# Patient Record
Sex: Male | Born: 2000 | Race: Black or African American | Hispanic: No | Marital: Single | Smoking: Never smoker
Health system: Southern US, Community
[De-identification: ages and names within clinical notes are randomized; demographics above are authoritative.]

## PROBLEM LIST (undated history)

## (undated) DIAGNOSIS — F909 Attention-deficit hyperactivity disorder, unspecified type: Secondary | ICD-10-CM

## (undated) DIAGNOSIS — F913 Oppositional defiant disorder: Secondary | ICD-10-CM

## (undated) DIAGNOSIS — R4689 Other symptoms and signs involving appearance and behavior: Secondary | ICD-10-CM

## (undated) DIAGNOSIS — F4310 Post-traumatic stress disorder, unspecified: Secondary | ICD-10-CM

---
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
## 2022-04-26 ENCOUNTER — Ambulatory Visit
Admission: EM | Admit: 2022-04-26 | Discharge: 2022-04-26 | Disposition: A | Discharge status: Home / Self Care | Payer: Medicaid Other | Attending: Physician Assistant | Admitting: Physician Assistant

## 2022-04-26 DIAGNOSIS — H1031 Unspecified acute conjunctivitis, right eye: Secondary | ICD-10-CM

## 2022-04-26 MED ORDER — POLYMYXIN B-TRIMETHOPRIM 10000-0.1 UNIT/ML-% OP SOLN: 1.0000 [drp] | OPHTHALMIC | 0 refills | Status: AC

## 2022-04-26 NOTE — ED Triage Notes (Signed)
Pt stays right eye redness and itching for the past 2 days.

## 2022-04-26 NOTE — ED Provider Notes (Signed)
[facility]    CSN: [id number] Arrival date & time: 04/26/22  6440      History   Chief Complaint Chief Complaint  Patient presents with   Conjunctivitis    HPI Peter Stephens is a 21 y.o. male.   Patient here today for evaluation of right eye redness and itching that is been ongoing for the last 2 days.  He reports he did have a headache initially but this has resolved.  He has had some drainage from his right eye.  He reports some occasional nausea but no vomiting.  He does not report any treatment for symptoms.  The history is provided by the patient.  Conjunctivitis Pertinent negatives include no headaches and no shortness of breath.    Past Medical History:  Diagnosis Date   Attention deficit hyperactivity disorder (ADHD)    Oppositional defiant behavior    PTSD (post-traumatic stress disorder)     There are no problems to display for this patient.   History reviewed. No pertinent surgical history.     Home Medications    Prior to Admission medications   Medication Sig Start Date End Date Taking? Authorizing Provider  trimethoprim-polymyxin b (POLYTRIM) ophthalmic solution Place 1 drop into the right eye every 4 (four) hours for 7 days. 04/26/22 05/03/22 Yes Francene Finders, PA-C  predniSONE (DELTASONE) 10 MG tablet 3 tabs po qd days 1-5, 2 tabs po qd days 6-10, 1 tab po qd days 11-14 Patient not taking: Reported on 02/18/2020 11/11/16   Charmayne Sheer, NP    Family History History reviewed. No pertinent family history.  Social History Social History   Tobacco Use   Smoking status: Never   Smokeless tobacco: Never  Substance Use Topics   Alcohol use: No   Drug use: No     Allergies   Patient has no known allergies.   Review of Systems Review of Systems  Constitutional:  Negative for chills and fever.  Eyes:  Positive for discharge and redness.  Respiratory:  Negative for shortness of breath.   Gastrointestinal:  Positive for  nausea. Negative for vomiting.  Neurological:  Negative for headaches.     Physical Exam Triage Vital Signs ED Triage Vitals  Enc Vitals Group     BP 04/26/22 1007 121/84     Pulse Rate 04/26/22 1007 79     Resp 04/26/22 1007 16     Temp 04/26/22 1007 97.8 F (36.6 C)     Temp Source 04/26/22 1007 Oral     SpO2 04/26/22 1007 98 %     Weight --      Height --      Head Circumference --      Peak Flow --      Pain Score 04/26/22 1008 6     Pain Loc --      Pain Edu? --      Excl. in [place]? --    No data found.  Updated Vital Signs BP 121/84 (BP Location: Left Arm)   Pulse 79   Temp 97.8 F (36.6 C) (Oral)   Resp 16   SpO2 98%      Physical Exam Vitals and nursing note reviewed.  Constitutional:      General: He is not in acute distress.    Appearance: Normal appearance. He is not ill-appearing.  HENT:     Head: Normocephalic and atraumatic.     Nose: Nose normal. No congestion or rhinorrhea.  Eyes:  Extraocular Movements: Extraocular movements intact.     Pupils: Pupils are equal, round, and reactive to light.     Comments: Right conjunctiva injected, left conjunctiva normal  Cardiovascular:     Rate and Rhythm: Normal rate.  Pulmonary:     Effort: Pulmonary effort is normal. No respiratory distress.  Neurological:     Mental Status: He is alert.  Psychiatric:        Mood and Affect: Mood normal.        Behavior: Behavior normal.      UC Treatments / Results  Labs (all labs ordered are listed, but only abnormal results are displayed) Labs Reviewed - No data to display  EKG   Radiology No results found.  Procedures Procedures (including critical care time)  Medications Ordered in UC Medications - No data to display  Initial Impression / Assessment and Plan / UC Course  I have reviewed the triage vital signs and the nursing notes.  Pertinent labs & imaging results that were available during my care of the patient were reviewed by me and  considered in my medical decision making (see chart for details).    Will treat with antibiotic drops to cover conjunctivitis.  Encouraged follow-up if no gradual improvement or with any further concerns.  Final Clinical Impressions(s) / UC Diagnoses   Final diagnoses:  Acute conjunctivitis of right eye, unspecified acute conjunctivitis type   Discharge Instructions   None    ED Prescriptions     Medication Sig Dispense Auth. Provider   trimethoprim-polymyxin b (POLYTRIM) ophthalmic solution Place 1 drop into the right eye every 4 (four) hours for 7 days. 10 mL Tomi Bamberger, PA-C      PDMP not reviewed this encounter.   Tomi Bamberger, PA-C 04/26/22 1202

## 2022-10-09 ENCOUNTER — Institutional Professional Consult (permissible substitution): Payer: Medicaid Other | Admitting: Medical

## 2022-10-11 ENCOUNTER — Ambulatory Visit: Payer: Medicaid Other | Admitting: Medical

## 2022-10-11 VITALS — BP 110/70 | HR 95 | Ht 65.0 in | Wt 157.6 lb

## 2022-10-11 DIAGNOSIS — F909 Attention-deficit hyperactivity disorder, unspecified type: Secondary | ICD-10-CM | POA: Diagnosis not present

## 2022-10-11 DIAGNOSIS — G479 Sleep disorder, unspecified: Secondary | ICD-10-CM

## 2022-10-11 MED ORDER — LISDEXAMFETAMINE DIMESYLATE 30 MG PO CAPS: 30.0000 mg | ORAL_CAPSULE | Freq: Every day | ORAL | 0 refills | Status: DC

## 2022-10-11 MED ORDER — TRAZODONE HCL 50 MG PO TABS: 25.0000 mg | ORAL_TABLET | Freq: Every evening | ORAL | 0 refills | Status: DC | PRN

## 2022-10-11 NOTE — Progress Notes (Signed)
Subjective:  Peter Stephens is a 22 y.o. male who presents for Chief Complaint  Patient presents with   get established    Get established. Wants to get back on medication for ADHD- stopped med since 2021. Wants on Vynase, having some stress with housing issues     Here as a new patient.   Was seeing pediatrics prior.   Had been seeing them the last 10 years.  He notes hx/o ADHD.  He notes hx/o sleeping issues.  Has trouble with focus and getting tasks done.   Was last on medication 2021.  Wants to get back on medication to help with focus, distractions.   He notes depression in the past, but not a big issue now.   Does feels stressed at times.  Hospitalized in the past for behavior issues, 22yo.  No current or prior SI/HI.  Grew up with grandparents, 1 passed 2 years ago.  Interacts with grandmother, 2 brothes and aunt.  Was on vyvanse and guanfacine prior  Has relationship with mother.  Doesn't have relationship with father.    Was on trazodone for sleep.    Working full time at [employer], been there 6 months.   Lives with grandmother temporarily.    Smokes occasionally.  No alcohol.  No drugs.  Exercise - calisthenics.    Looking into [employer] program.  This will help him with a trade.  No other aggravating or relieving factors.    No other c/o.  Past Medical History:  Diagnosis Date   Attention deficit hyperactivity disorder (ADHD)    Oppositional defiant behavior    PTSD (post-traumatic stress disorder)    No current outpatient medications on file prior to visit.   No current facility-administered medications on file prior to visit.     The following portions of the patient's history were reviewed and updated as appropriate: allergies, current medications, past family history, past medical history, past social history, past surgical history and problem list.  ROS Otherwise as in subjective above    Objective: BP 110/70   Pulse 95   Ht 5\' 5"  (1.651 m)   Wt 157 lb  9.6 oz (71.5 kg)   BMI 26.23 kg/m   General appearance: alert, no distress, well developed, well nourished Neck: supple, no lymphadenopathy, no thyromegaly, no masses Heart: RRR, normal S1, S2, no murmurs Lungs: CTA bilaterally, no wheezes, rhonchi, or rales Pulses: 2+ radial pulses, 2+ pedal pulses, normal cap refill Ext: no edema Psych: pleasant, a little anxious appearing    Assessment: Encounter Diagnoses  Name Primary?   Attention deficit hyperactivity disorder (ADHD), unspecified ADHD type Yes   Sleep disturbances      Plan: We discussed the presenting concerns and prior mental health history  I advised that I am not a psychiatrist or counselor but we can work with him for the time being assuming he is getting good response and compliance with the medication  We discussed proper use of medication, keeping medication safe keeping, not using inappropriate amounts.  Begin back on Vyvanse.  Discussed risk and benefit of the medication, proper use of medication.  He is going to try this first without the trazodone but if he starts having sleep issues he can begin back on trazodone which she has been on in the past.  He notes prior to being on higher doses of both medicines along with oxcarbazepine as well  We will try and get prior records  Peter Stephens was seen today for get established.  Diagnoses and all orders for this visit:  Attention deficit hyperactivity disorder (ADHD), unspecified ADHD type  Sleep disturbances  Other orders -     traZODone (DESYREL) 50 MG tablet; Take 0.5-1 tablets (25-50 mg total) by mouth at bedtime as needed for sleep. -     lisdexamfetamine (VYVANSE) 30 MG capsule; Take 1 capsule (30 mg total) by mouth daily.    Follow up: 63mo

## 2022-10-11 NOTE — Patient Instructions (Signed)
Recommendations: Begin back on Vyvanse once daily Try this for now.  If you start to have sleep issues, then you can add Trazodone at night 1/2 - 1 tablet nightly as needed. If you are not having sleep issues, then don't start the Trazodone. Keep your medicaiton safe, don't let others use the medication We will check random drug screens from time to time Have a consistent schedule Exercise regularly Avoid caffeine and soda and other sugary drinks Lets recheck in 1 month, sooner if needed   Attention Deficit Hyperactivity Disorder, Adult Attention deficit hyperactivity disorder (ADHD) is a mental health disorder that starts during childhood. For many people with ADHD, the disorder continues into the adult years. Treatment can help you manage your symptoms. There are three main types of ADHD: Inattentive. With this type, adults have difficulty paying attention. This may affect cognitive abilities. Hyperactive-impulsive. With this type, adults have a lot of energy and have difficulty controlling their behavior. Combination type. Some people may have symptoms of both types. What are the causes? The exact cause of ADHD is not known. Most experts believe a person's genes and environment possibly contribute to ADHD. What increases the risk? The following factors may make you more likely to develop this condition: Having a first-degree relative such as a parent, brother, or sister, with the condition. Being born before 97 weeks of pregnancy (prematurely) or at a low birth weight. Being born to a mother who smoked tobacco or drank alcohol during pregnancy. Having experienced a brain injury. Being exposed to lead or other toxins in the womb or early in life. What are the signs or symptoms? Symptoms of this condition depend on the type of ADHD. Symptoms of the inattentive type include: Difficulty paying attention or following instructions. Often making simple mistakes. Being  disorganized. Avoiding tasks that require time and attention. Losing and forgetting things. Symptoms of the hyperactive-impulsive type include: Restlessness. Talking out of turn, interrupting others, or talking too much. Difficulty with: Sitting still. Feeling motivated. Relaxing. Waiting in line or waiting for a turn. People with the combination type have symptoms of both of the other types. In adults, this condition may lead to certain problems, such as: Keeping jobs. Performing tasks at work. Having stable relationships. Being on time or keeping to a schedule. How is this diagnosed? This condition is diagnosed based on your current symptoms and your history of symptoms. The diagnosis can be made by a health care provider such as a primary care provider or a mental health care specialist. Your health care provider may use a symptom checklist or a behavior rating scale to evaluate your symptoms. Your health care provider may also want to talk with people who have observed your behaviors throughout your life. How is this treated? This condition can be treated with medicines and behavior therapy. Medicines may be the best option to reduce impulsive behaviors and improve attention. Your health care provider may recommend: Stimulant medicines. These are the most common medicines used for adult ADHD. They affect certain chemicals in the brain (neurotransmitters) and improve your ability to control your symptoms. A non-stimulant medicine. These medicines can also improve focus, attention, and impulsive behavior. It may take weeks to months to see the effects of this medicine. Counseling and behavioral management are also important for treating ADHD. Counseling is often used along with medicine. Your health care provider may suggest: Cognitive behavioral therapy (CBT). This type of therapy teaches you to replace negative thoughts and actions with positive thoughts and  actions. When used as part of  ADHD treatment, this therapy may also include: Coping strategies for organization, time management, impulse control, and stress reduction. Mindfulness and meditation training. Behavioral management. You may work with a Leisure [employer] manager who is specially trained to help people with ADHD manage and organize activities and function more effectively. Follow these instructions at home: Medicines  Take over-the-counter and prescription medicines only as told by your health care provider. Talk with your health care provider about the possible side effects of your medicines and how to manage them. Alcohol use Do not drink alcohol if: Your health care provider tells you not to drink. You are pregnant, may be pregnant, or are planning to become pregnant. If you drink alcohol: Limit how much you use to: 0-1 drink a day for women. 0-2 drinks a day for men. Know how much alcohol is in your drink. In the [place] one drink equals one 12 oz bottle of beer (355 mL), one 5 oz glass of wine (148 mL), or one 1 oz glass of hard liquor (44 mL). Lifestyle  Do not use illegal drugs. Get enough sleep. Eat a healthy diet. Exercise regularly. Exercise can help to reduce stress and anxiety. General instructions Learn as much as you can about adult ADHD, and work closely with your health care providers to find the treatments that work best for you. Follow the same schedule each day. Use reminder devices like notes, calendars, and phone apps to stay on time and organized. Keep all follow-up visits. Your health care provider will need to monitor your condition and adjust your treatment over time. Where to find more information A health care provider may be able to recommend resources that are available online or over the phone. You could start with: Attention Deficit Disorder Association (ADDA): [employer] National Institute of Mental Health [facility]): https://www.frey.org/ Contact a health care provider if: Your symptoms continue to cause  problems. You have side effects from your medicine, such as: Repeated muscle twitches, coughing, or speech outbursts. Sleep problems. Loss of appetite. Dizziness. Unusually fast heartbeat. Stomach pains. Headaches. You are struggling with anxiety, depression, or substance abuse. Get help right away if: You have a severe reaction to a medicine. This symptom may be an emergency. Get help right away. Call 911. Do not wait to see if the symptom will go away. Do not drive yourself to the hospital. Take one of these steps if you feel like you may hurt yourself or others, or have thoughts about taking your own life: Go to your nearest emergency room. Call 911. Call the [place] at [phone] or 988. This is open 24 hours a day Text the Crisis Text Line at (805) 263-4156. Summary ADHD is a mental health disorder that starts during childhood and often continues into your adult years. The exact cause of ADHD is not known. Most experts believe genetics and environmental factors contribute to ADHD. There is no cure for ADHD, but treatment with medicine, cognitive behavioral therapy, or behavioral management can help you manage your condition. This information is not intended to replace advice given to you by your health care provider. Make sure you discuss any questions you have with your health care provider. Document Revised: 11/02/2021 Document Reviewed: 11/02/2021 Elsevier Patient Education  [place].

## 2022-10-25 ENCOUNTER — Other Ambulatory Visit: Payer: Self-pay | Admitting: Family Medicine

## 2022-10-25 MED ORDER — LISDEXAMFETAMINE DIMESYLATE 30 MG PO CAPS: 30.0000 mg | ORAL_CAPSULE | Freq: Every day | ORAL | 0 refills | Status: DC

## 2022-11-19 ENCOUNTER — Other Ambulatory Visit: Payer: Self-pay | Admitting: Medical

## 2022-11-19 ENCOUNTER — Telehealth: Payer: Self-pay | Admitting: Medical

## 2022-11-19 ENCOUNTER — Other Ambulatory Visit (HOSPITAL_COMMUNITY): Payer: Self-pay

## 2022-11-19 MED ORDER — TRAZODONE HCL 50 MG PO TABS
25.0000 mg | ORAL_TABLET | Freq: Every evening | ORAL | 0 refills | Status: DC | PRN
  Filled 2022-11-19: qty 30, 30d supply, fill #0

## 2022-11-19 MED ORDER — LISDEXAMFETAMINE DIMESYLATE 30 MG PO CAPS
30.0000 mg | ORAL_CAPSULE | Freq: Every day | ORAL | 0 refills | Status: AC
  Filled 2022-11-19: qty 30, 30d supply, fill #0

## 2022-11-19 NOTE — Telephone Encounter (Signed)
Tejuan called and stated he hasn't been able to pick up his vyvanse and trazodone because the current pharmacy was out of stock and he wasn't aware to try and call other pharmacies until someone helped him. He would like these prescriptions sent to [facility]   Once it has been changed ill give him a call and let him know.

## 2022-11-20 ENCOUNTER — Other Ambulatory Visit (HOSPITAL_COMMUNITY): Payer: Self-pay

## 2022-12-16 ENCOUNTER — Ambulatory Visit
Admission: EM | Admit: 2022-12-16 | Discharge: 2022-12-16 | Disposition: A | Discharge status: Home / Self Care | Payer: Medicaid Other | Attending: Internal Medicine | Admitting: Internal Medicine

## 2022-12-16 DIAGNOSIS — R300 Dysuria: Secondary | ICD-10-CM | POA: Insufficient documentation

## 2022-12-16 DIAGNOSIS — Z7251 High risk heterosexual behavior: Secondary | ICD-10-CM | POA: Diagnosis present

## 2022-12-16 LAB — POCT URINALYSIS DIP (MANUAL ENTRY)
Bilirubin, UA: NEGATIVE
Glucose, UA: NEGATIVE mg/dL
Nitrite, UA: NEGATIVE
Protein Ur, POC: NEGATIVE mg/dL
Spec Grav, UA: 1.025 (ref 1.010–1.025)
Urobilinogen, UA: 0.2 E.U./dL
pH, UA: 6.5 (ref 5.0–8.0)

## 2022-12-16 NOTE — ED Provider Notes (Signed)
[facility]    CSN: [id number] Arrival date & time: 12/16/22  1005      History   Chief Complaint Chief Complaint  Patient presents with   Urinary Tract Infection    HPI Peter Stephens is a 22 y.o. male.   Patient presents to urgent care for evaluation of dysuria that started this morning. States he only experiences burning at the end of the urinary stream. Denies weak urinary stream, urinary hesitancy, and urinary frequency. States he woke up with some lower abdominal pain this morning but this has since resolved without intervention. No gross hematuria or fever/chills. Denies N/V/D. States he has been in a monogamous relationship for 1.5 months with male partner with unprotected intercourse. No known exposures to STDs. Denies penile discharge, rash, and odor/itching. Denies recent antibiotic/steroid use and no allergies to antibiotics.    Urinary Tract Infection   Past Medical History:  Diagnosis Date   Attention deficit hyperactivity disorder (ADHD)    Oppositional defiant behavior    PTSD (post-traumatic stress disorder)     There are no problems to display for this patient.   History reviewed. No pertinent surgical history.     Home Medications    Prior to Admission medications   Medication Sig Start Date End Date Taking? Authorizing Provider  lisdexamfetamine (VYVANSE) 30 MG capsule Take 1 capsule (30 mg total) by mouth daily. 10/25/22   Ronnald Nian, MD  lisdexamfetamine (VYVANSE) 30 MG capsule Take 1 capsule (30 mg total) by mouth daily. 11/19/22   Tysinger, Kermit Balo, PA-C  traZODone (DESYREL) 50 MG tablet Take 0.5-1 tablets (25-50 mg total) by mouth at bedtime as needed for sleep. 11/19/22   Tysinger, Kermit Balo, PA-C    Family History History reviewed. No pertinent family history.  Social History Social History   Tobacco Use   Smoking status: Never   Smokeless tobacco: Never  Substance Use Topics   Alcohol use: No   Drug use: No      Allergies   Patient has no known allergies.   Review of Systems Review of Systems Per HPI  Physical Exam Triage Vital Signs ED Triage Vitals [12/16/22 1033]  Enc Vitals Group     BP 122/83     Pulse Rate 90     Resp 20     Temp 97.9 F (36.6 C)     Temp Source Oral     SpO2 97 %     Weight      Height      Head Circumference      Peak Flow      Pain Score 0     Pain Loc      Pain Edu?      Excl. in GC?    No data found.  Updated Vital Signs BP 122/83 (BP Location: Left Arm)   Pulse 90   Temp 97.9 F (36.6 C) (Oral)   Resp 20   SpO2 97%   Visual Acuity Right Eye Distance:   Left Eye Distance:   Bilateral Distance:    Right Eye Near:   Left Eye Near:    Bilateral Near:     Physical Exam Vitals and nursing note reviewed.  Constitutional:      Appearance: He is not ill-appearing or toxic-appearing.  HENT:     Head: Normocephalic and atraumatic.     Right Ear: Hearing and external ear normal.     Left Ear: Hearing and external ear normal.  Nose: Nose normal.     Mouth/Throat:     Lips: Pink.     Mouth: Mucous membranes are moist.     Pharynx: No posterior oropharyngeal erythema.  Eyes:     General: Lids are normal. Vision grossly intact. Gaze aligned appropriately.     Extraocular Movements: Extraocular movements intact.     Conjunctiva/sclera: Conjunctivae normal.  Pulmonary:     Effort: Pulmonary effort is normal.  Abdominal:     General: Abdomen is flat.     Palpations: Abdomen is soft.     Tenderness: There is no right CVA tenderness or left CVA tenderness.  Musculoskeletal:     Cervical back: Neck supple.  Skin:    General: Skin is warm and dry.     Capillary Refill: Capillary refill takes less than 2 seconds.     Findings: No rash.  Neurological:     General: No focal deficit present.     Mental Status: He is alert and oriented to person, place, and time. Mental status is at baseline.     Cranial Nerves: No dysarthria or  facial asymmetry.  Psychiatric:        Mood and Affect: Mood normal.        Speech: Speech normal.        Behavior: Behavior normal.        Thought Content: Thought content normal.        Judgment: Judgment normal.      UC Treatments / Results  Labs (all labs ordered are listed, but only abnormal results are displayed) Labs Reviewed  POCT URINALYSIS DIP (MANUAL ENTRY) - Abnormal; Notable for the following components:      Result Value   Clarity, UA cloudy (*)    Ketones, POC UA moderate (40) (*)    Blood, UA trace-intact (*)    Leukocytes, UA Moderate (2+) (*)    All other components within normal limits  URINE CULTURE  CYTOLOGY, (ORAL, ANAL, URETHRAL) ANCILLARY ONLY    EKG   Radiology No results found.  Procedures Procedures (including critical care time)  Medications Ordered in UC Medications - No data to display  Initial Impression / Assessment and Plan / UC Course  I have reviewed the triage vital signs and the nursing notes.  Pertinent labs & imaging results that were available during my care of the patient were reviewed by me and considered in my medical decision making (see chart for details).   1. Dysuria, unprotected sex STI labs pending.  Patient deflines HIV and syphilis testing today.  Will notify patient of positive results and treat accordingly when labs come back.  Patient to avoid sexual intercourse until screening testing comes back.  Education provided regarding safe sexual practices and patient encouraged to use protection to prevent spread of STIs.   Urinalysis shows signs concerning for possible UTI, however this is low on differential given age and recent unprotected intercourse with new partner. Will send urine for culture to rule out acute cystitis etiology. He is not experiencing any systemic signs/symptoms of infection and vitals are hemodynamically stable. Will treat for UTI as needed based on culture.  .Discussed physical exam and available  lab work findings in clinic with patient.  Counseled patient regarding appropriate use of medications and potential side effects for all medications recommended or prescribed today. Discussed red flag signs and symptoms of worsening condition,when to call the PCP office, return to urgent care, and when to seek higher level of care in  the emergency department. Patient verbalizes understanding and agreement with plan. All questions answered. Patient discharged in stable condition.    Final Clinical Impressions(s) / UC Diagnoses   Final diagnoses:  Dysuria  Unprotected sex     Discharge Instructions      Your STD testing has been sent to the lab and will come back in the next 2 to 3 days.  We will call you if any of your results are positive requiring treatment and treat you at that time.   If you do not receive a phone call from [place], this means your testing was negative.  Avoid sexual intercourse until your STD results come back.  If any of your STD results are positive, you will need to avoid sexual intercourse for 7 days while you are being treated to prevent spread of STD.  Condom use is the best way to prevent spread of STDs.  Your urine shows possible UTI.  I have sent the urine for culture and will call you if there is any growth of bacteria indicating UTI/treat at that time if needed.  Return to urgent care as needed.      ED Prescriptions   None    PDMP not reviewed this encounter.   Carlisle Beers, [place] 12/16/22 1109

## 2022-12-16 NOTE — Discharge Instructions (Signed)
Your STD testing has been sent to the lab and will come back in the next 2 to 3 days.  We will call you if any of your results are positive requiring treatment and treat you at that time.   If you do not receive a phone call from [place], this means your testing was negative.  Avoid sexual intercourse until your STD results come back.  If any of your STD results are positive, you will need to avoid sexual intercourse for 7 days while you are being treated to prevent spread of STD.  Condom use is the best way to prevent spread of STDs.  Your urine shows possible UTI.  I have sent the urine for culture and will call you if there is any growth of bacteria indicating UTI/treat at that time if needed.  Return to urgent care as needed.

## 2022-12-16 NOTE — ED Triage Notes (Signed)
Pt presents for UTI and STD testing.   Pt c/o dysuria, cloudy urine, abd pain X 1 week.   Denies penile discharge, states he has had new partners.

## 2022-12-17 LAB — CYTOLOGY, (ORAL, ANAL, URETHRAL) ANCILLARY ONLY
Chlamydia: POSITIVE — AB
Comment: NEGATIVE
Comment: NEGATIVE
Comment: NORMAL
Neisseria Gonorrhea: POSITIVE — AB
Trichomonas: POSITIVE — AB

## 2022-12-17 LAB — URINE CULTURE: Culture: NO GROWTH

## 2022-12-18 ENCOUNTER — Telehealth (HOSPITAL_COMMUNITY): Payer: Self-pay | Admitting: Emergency Medicine

## 2022-12-18 MED ORDER — DOXYCYCLINE HYCLATE 100 MG PO CAPS: 100.0000 mg | ORAL_CAPSULE | Freq: Two times a day (BID) | ORAL | 0 refills | Status: AC

## 2022-12-18 MED ORDER — METRONIDAZOLE 500 MG PO TABS: 2000.0000 mg | ORAL_TABLET | Freq: Once | ORAL | 0 refills | Status: AC

## 2022-12-18 NOTE — Telephone Encounter (Signed)
Per protocol, patient will need treatment with IM Rocephin 500mg for positive Gonorrhea.   Will also need treatment with Doxycycline and Flagyl.  Attempted to reach patient x 1, LVM Prescription sent to pharmacy on file [facility] notified  

## 2022-12-27 ENCOUNTER — Ambulatory Visit
Admission: EM | Admit: 2022-12-27 | Discharge: 2022-12-27 | Disposition: A | Discharge status: Home / Self Care | Payer: Medicaid Other | Attending: Internal Medicine | Admitting: Internal Medicine

## 2022-12-27 DIAGNOSIS — A64 Unspecified sexually transmitted disease: Secondary | ICD-10-CM

## 2022-12-27 MED ORDER — CEFTRIAXONE SODIUM 1 G IJ SOLR
0.5000 g | Freq: Once | INTRAMUSCULAR | Status: AC
  Administered 2022-12-27: 0.5 g via INTRAMUSCULAR

## 2022-12-27 NOTE — ED Triage Notes (Signed)
Pt here for STD treatment positive for Gonorrhea.

## 2023-01-13 ENCOUNTER — Telehealth: Payer: Self-pay | Admitting: Medical

## 2023-01-13 NOTE — Telephone Encounter (Signed)
Pt left message needs refill Vyvanse & Trazodone

## 2023-01-14 MED ORDER — TRAZODONE HCL 50 MG PO TABS: 25.0000 mg | ORAL_TABLET | Freq: Every evening | ORAL | 0 refills | Status: AC | PRN

## 2023-01-14 MED ORDER — LISDEXAMFETAMINE DIMESYLATE 30 MG PO CAPS: 30.0000 mg | ORAL_CAPSULE | Freq: Every day | ORAL | 0 refills | Status: AC
